# Patient Record
Sex: Male | Born: 1990 | Race: Black or African American | Hispanic: No | Marital: Married | Smoking: Never smoker
Health system: Southern US, Community
[De-identification: ages and names within clinical notes are randomized; demographics above are authoritative.]

## PROBLEM LIST (undated history)

## (undated) ENCOUNTER — Ambulatory Visit: Source: Home / Self Care

## (undated) DIAGNOSIS — I10 Essential (primary) hypertension: Secondary | ICD-10-CM

## (undated) DIAGNOSIS — R519 Headache, unspecified: Secondary | ICD-10-CM

## (undated) HISTORY — PX: NO PAST SURGERIES: SHX2092

## (undated) HISTORY — DX: Essential (primary) hypertension: I10

## (undated) HISTORY — DX: Headache, unspecified: R51.9

---

## 2014-12-27 ENCOUNTER — Encounter (HOSPITAL_BASED_OUTPATIENT_CLINIC_OR_DEPARTMENT_OTHER): Payer: Self-pay | Admitting: *Deleted

## 2014-12-27 ENCOUNTER — Emergency Department (HOSPITAL_BASED_OUTPATIENT_CLINIC_OR_DEPARTMENT_OTHER): Payer: BLUE CROSS/BLUE SHIELD

## 2014-12-27 ENCOUNTER — Emergency Department (HOSPITAL_BASED_OUTPATIENT_CLINIC_OR_DEPARTMENT_OTHER)
Admission: EM | Admit: 2014-12-27 | Discharge: 2014-12-27 | Disposition: A | Discharge status: Home / Self Care | Payer: BLUE CROSS/BLUE SHIELD | Attending: Physician Assistant | Admitting: Physician Assistant

## 2014-12-27 DIAGNOSIS — R1011 Right upper quadrant pain: Secondary | ICD-10-CM | POA: Diagnosis not present

## 2014-12-27 DIAGNOSIS — R0789 Other chest pain: Secondary | ICD-10-CM | POA: Diagnosis not present

## 2014-12-27 LAB — CBC WITH DIFFERENTIAL/PLATELET
Basophils Absolute: 0 10*3/uL (ref 0.0–0.1)
Basophils Relative: 0 % (ref 0–1)
Eosinophils Absolute: 0.1 10*3/uL (ref 0.0–0.7)
Eosinophils Relative: 1 % (ref 0–5)
HEMATOCRIT: 40.2 % (ref 39.0–52.0)
Hemoglobin: 13.1 g/dL (ref 13.0–17.0)
LYMPHS ABS: 2.8 10*3/uL (ref 0.7–4.0)
Lymphocytes Relative: 33 % (ref 12–46)
MCH: 28.2 pg (ref 26.0–34.0)
MCHC: 32.6 g/dL (ref 30.0–36.0)
MCV: 86.6 fL (ref 78.0–100.0)
Monocytes Absolute: 0.8 10*3/uL (ref 0.1–1.0)
Monocytes Relative: 9 % (ref 3–12)
NEUTROS ABS: 5 10*3/uL (ref 1.7–7.7)
NEUTROS PCT: 57 % (ref 43–77)
PLATELETS: 266 10*3/uL (ref 150–400)
RBC: 4.64 MIL/uL (ref 4.22–5.81)
RDW: 13.6 % (ref 11.5–15.5)
WBC: 8.7 10*3/uL (ref 4.0–10.5)

## 2014-12-27 LAB — URINE MICROSCOPIC-ADD ON

## 2014-12-27 LAB — URINALYSIS, ROUTINE W REFLEX MICROSCOPIC
Bilirubin Urine: NEGATIVE
Glucose, UA: NEGATIVE mg/dL
Hgb urine dipstick: NEGATIVE
Ketones, ur: NEGATIVE mg/dL
Nitrite: NEGATIVE
PH: 6 (ref 5.0–8.0)
Protein, ur: NEGATIVE mg/dL
Specific Gravity, Urine: 1.019 (ref 1.005–1.030)
Urobilinogen, UA: 0.2 mg/dL (ref 0.0–1.0)

## 2014-12-27 LAB — COMPREHENSIVE METABOLIC PANEL
ALK PHOS: 77 U/L (ref 38–126)
ALT: 27 U/L (ref 17–63)
ANION GAP: 8 (ref 5–15)
AST: 29 U/L (ref 15–41)
Albumin: 4.1 g/dL (ref 3.5–5.0)
BUN: 12 mg/dL (ref 6–20)
CHLORIDE: 106 mmol/L (ref 101–111)
CO2: 25 mmol/L (ref 22–32)
CREATININE: 0.97 mg/dL (ref 0.61–1.24)
Calcium: 9.1 mg/dL (ref 8.9–10.3)
GFR calc Af Amer: >60 mL/min (ref >60)
Glucose, Bld: 100 mg/dL — ABNORMAL HIGH (ref 65–99)
Potassium: 4.1 mmol/L (ref 3.5–5.1)
SODIUM: 139 mmol/L (ref 135–145)
Total Bilirubin: 0.4 mg/dL (ref 0.3–1.2)
Total Protein: 7.4 g/dL (ref 6.5–8.1)

## 2014-12-27 LAB — LIPASE, BLOOD: LIPASE: 25 U/L (ref 22–51)

## 2014-12-27 NOTE — ED Notes (Signed)
Pt. currently at ultrasound.  

## 2014-12-27 NOTE — ED Notes (Signed)
Pt waiting for md to discuss results of testing and will then dispo home.  Denies needs, currently sitting up on end of bed in nad.

## 2014-12-27 NOTE — ED Provider Notes (Signed)
CSN: [id number]     Arrival date & time 12/27/14  1038 History   First MD Initiated Contact with Patient 12/27/14 1048     No chief complaint on file.    (Consider location/radiation/quality/duration/timing/severity/associated sxs/prior Treatment) HPI Comments: Patient is a 18 her old male with past medical history significant for morbid obesity. Presenting today with right upper quadrant pain. It started today when he woke up. Is not made worse by eating. It is located in his right lower lung/right upper quadrant. No fevers. Able to eat and drink normally. No history of abdominal surgeries.   History reviewed. No pertinent past medical history. History reviewed. No pertinent past surgical history. No family history on file. History  Substance Use Topics  . Smoking status: Never Smoker   . Smokeless tobacco: Not on file  . Alcohol Use: Not on file    Review of Systems  Constitutional: Negative for activity change.  Respiratory: Negative for shortness of breath.   Cardiovascular: Negative for chest pain.  Gastrointestinal: Negative for abdominal pain.  Genitourinary: Negative for difficulty urinating.  Skin: Negative for rash.  Neurological: Negative for headaches.  Psychiatric/Behavioral: Negative for behavioral problems.      Allergies  Review of patient's allergies indicates no known allergies.  Home Medications   Prior to Admission medications   Not on File   BP 156/98 mmHg  Pulse 88  Temp(Src) 98.6 F (37 C) (Oral)  Resp 18  Ht  (1.753 m)  Wt 285 lb (129.275 kg)  BMI 42.07 kg/m2  SpO2 100% Physical Exam  Constitutional: He is oriented to person, place, and time. He appears well-nourished.  HENT:  Head: Normocephalic.  Mouth/Throat: Oropharynx is clear and moist.  Eyes: Conjunctivae are normal.  Neck: No tracheal deviation present.  Cardiovascular: Normal rate.   Pulmonary/Chest: Effort normal. No stridor. No respiratory distress.  Mild tenderness to  right lateral chest wall.  Abdominal: Soft. There is no guarding.  Mild tenderness to right upper quadrant.  Musculoskeletal: Normal range of motion. He exhibits no edema.  Neurological: He is oriented to person, place, and time. No cranial nerve deficit.  Skin: Skin is warm and dry. No rash noted. He is not diaphoretic.  Psychiatric: He has a normal mood and affect. His behavior is normal.  Nursing note and vitals reviewed.   ED Course  Procedures (including critical care time) Labs Review Labs Reviewed  URINALYSIS, ROUTINE W REFLEX MICROSCOPIC (NOT AT [facility]) - Abnormal; Notable for the following:    Leukocytes, UA TRACE (*)    All other components within normal limits  URINE MICROSCOPIC-ADD ON - Abnormal; Notable for the following:    Squamous Epithelial / LPF FEW (*)    Bacteria, UA FEW (*)    All other components within normal limits  LIPASE, BLOOD  COMPREHENSIVE METABOLIC PANEL  CBC WITH DIFFERENTIAL/PLATELET    Imaging Review No results found.   EKG Interpretation None      MDM   Final diagnoses:  RUQ pain    She is a 24 year old male with morbid obesity presenting today with right upper quadrant pain. It is not made worse on moving. Not made worse by food. It is intermittent made worse with deep breaths. Patient perc negative.  Concern for gallbladder disease versus occult pneumonia versusmsk pain. We will get labs and ultrasound chest x-ray.  If all normal plan to discharge patient home.    Dakarai Mcglocklin Randall An, MD 12/27/14 1459

## 2014-12-27 NOTE — ED Notes (Signed)
C/o sharp stabbing pain on right side in rib area sudden onset this am. Pain comes and goes and is dull pain when it is not a stabbing pain. No injury. No urinating sx. No radiation.

## 2014-12-27 NOTE — Discharge Instructions (Signed)

## 2016-06-03 IMAGING — US US ABDOMEN COMPLETE
1 series · 13 of 25 positions shown · non-contrast
Comparison: None.

CLINICAL DATA: 24-year-old male with right upper quadrant pain for
several hours. Initial encounter.

EXAM:
ULTRASOUND ABDOMEN COMPLETE

[Series 1: us abdomen complete · 0.16mm/px · 13 of 77 slices shown]
[im 1/77]
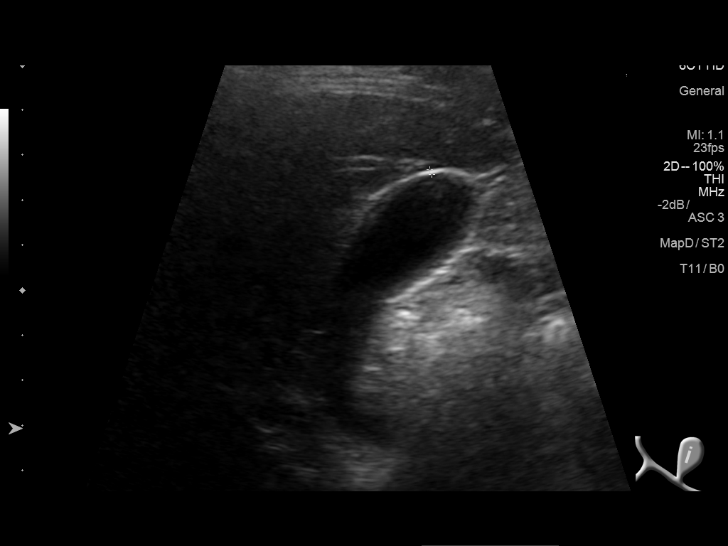
[im 7/77]
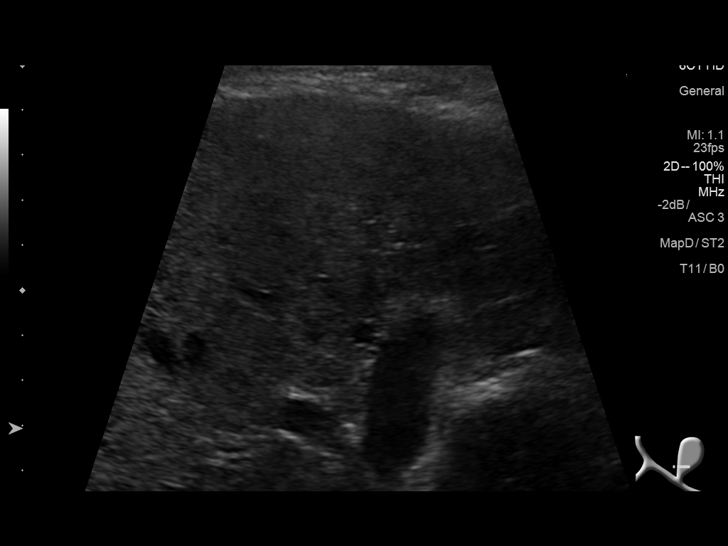
[im 13/77]
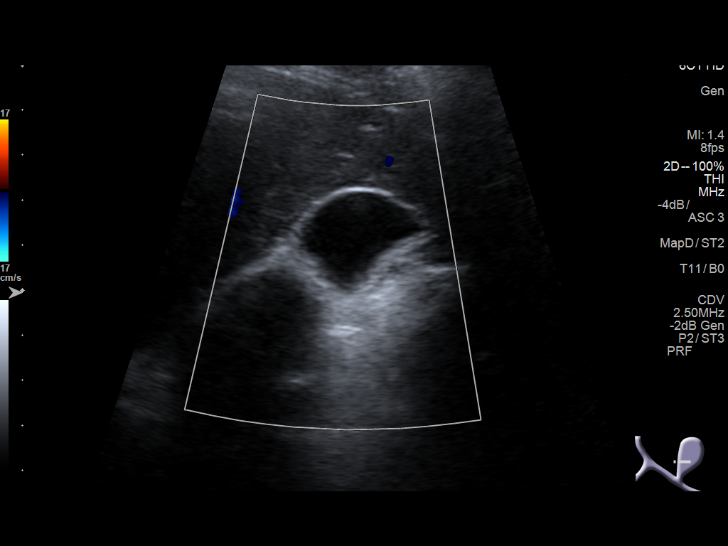
[im 20/77]
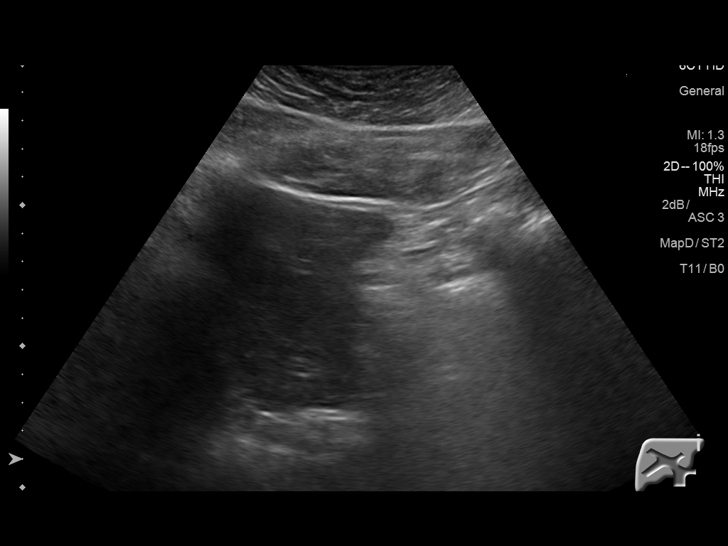
[im 26/77]
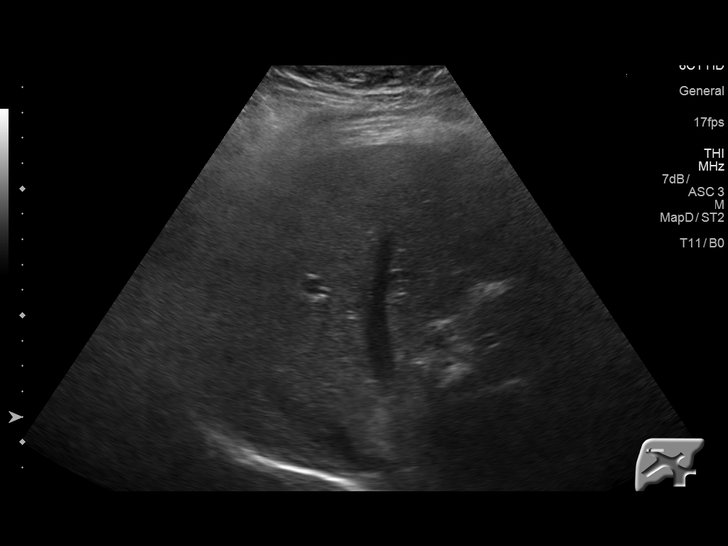
[im 32/77]
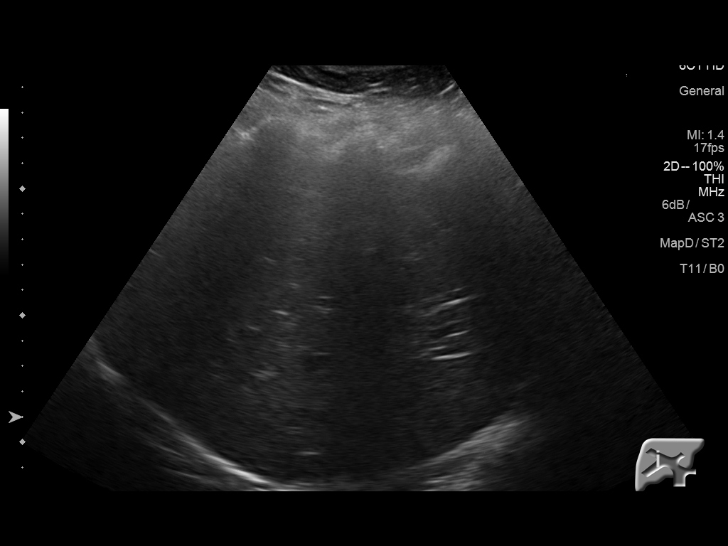
[im 39/77]
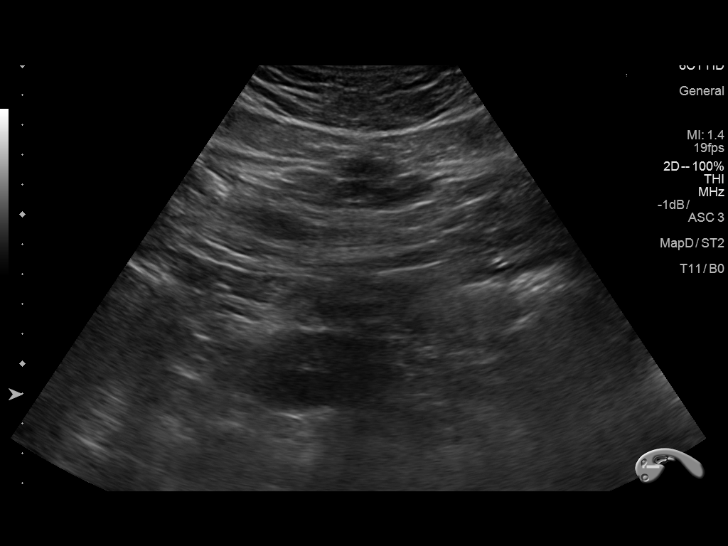
[im 45/77]
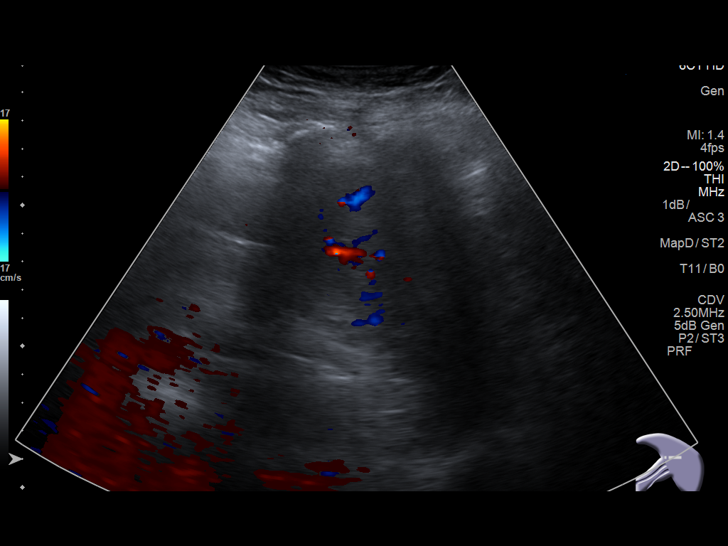
[im 51/77]
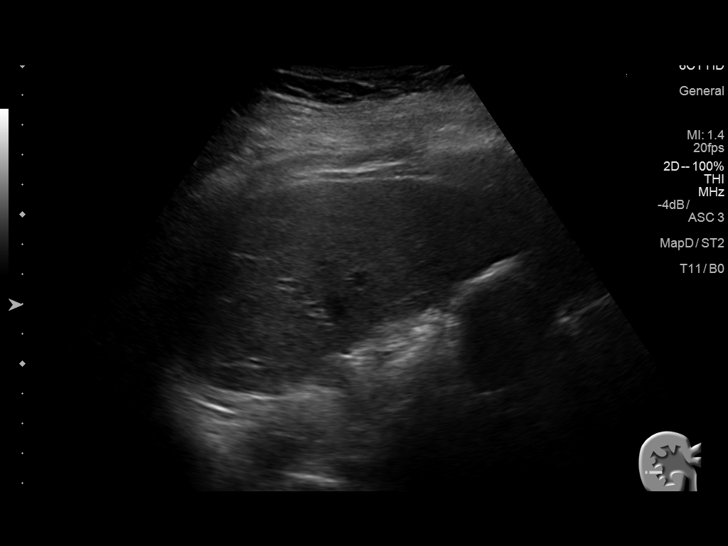
[im 58/77]
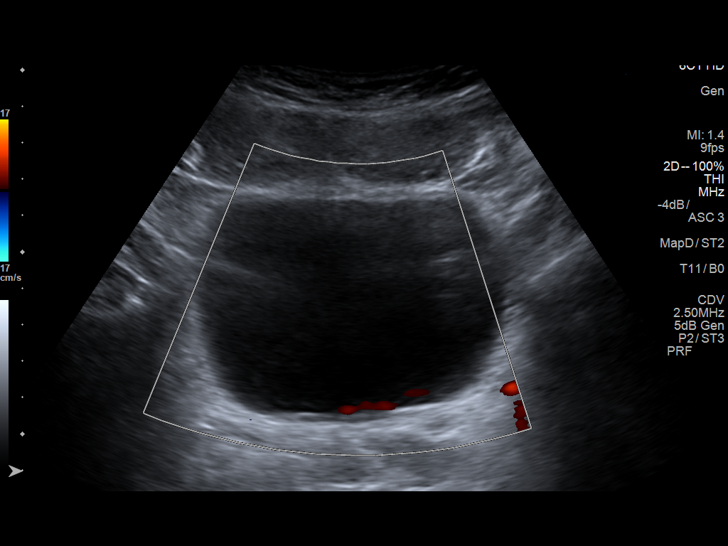
[im 64/77]
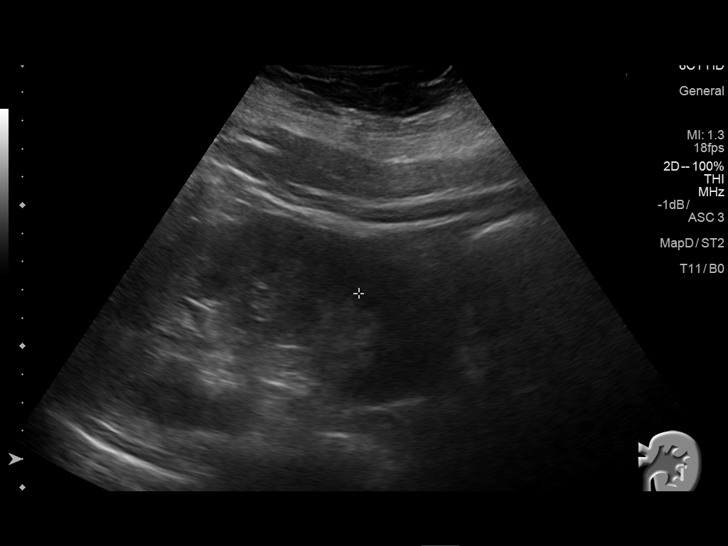
[im 70/77]
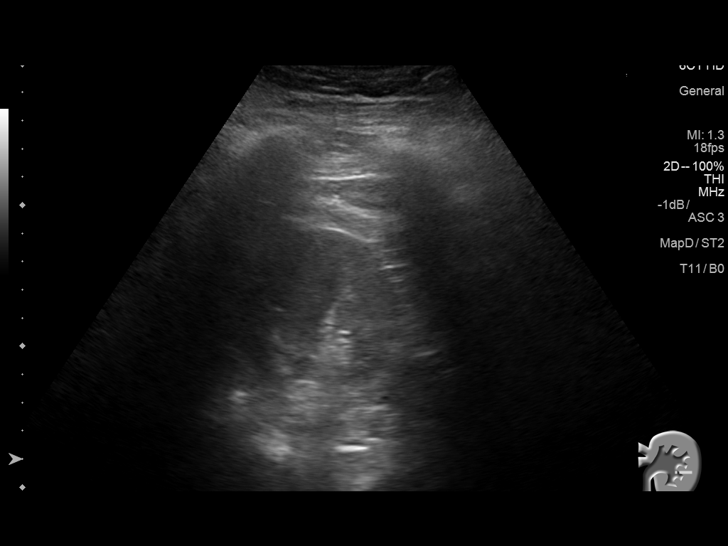
[im 77/77]
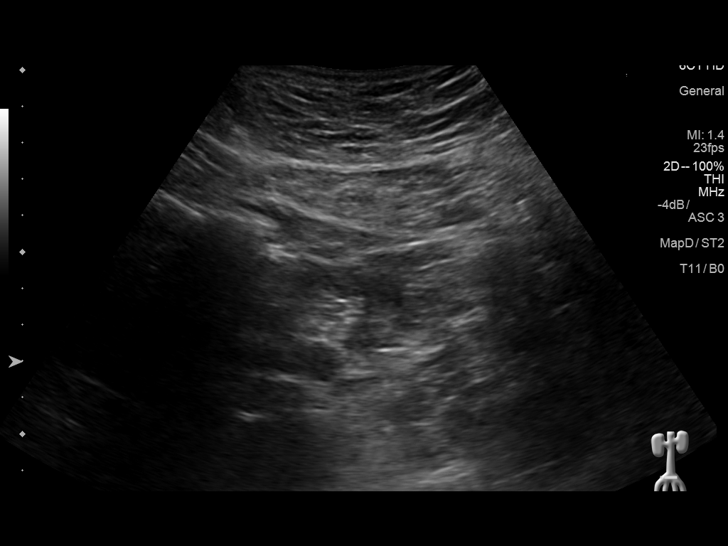

[13 of 25 positions shown; findings below may reference images not displayed]

FINDINGS: Gallbladder: Partially decompressed. Gallbladder wall thickness
within normal limits, 2 mm. No gallstones or sludge identified. No
pericholecystic fluid. No sonographic Murphy sign elicited.

Common bile duct: Diameter: 4 mm, normal

Liver: No focal lesion identified. Within normal limits in
parenchymal echogenicity.

IVC: No abnormality visualized.

Pancreas: Echogenicity within normal limits for age.

Spleen: Size and appearance within normal limits.

Right Kidney: The right kidney is located in the right lower
quadrant, hemipelvis. It measures 11.1 cm in length. No
hydronephrosis or renal mass is evident..

Left Kidney: Length: 13.1 cm. Orthotopic. Echogenicity within normal
limits. No mass or hydronephrosis visualized.

Abdominal aorta: No aneurysm visualized.

Other findings: Both ureteral jets are detected with Doppler
ultrasound of the urinary bladder.
IMPRESSION: 1. No gallstones identified. No evidence of acute cholecystitis or
biliary obstruction.
2. Pelvic kidney on the right, normal anatomic variant.

## 2016-08-23 IMAGING — CR DG CHEST 2V
2 series · 2 of 2 positions shown · non-contrast
Comparison: None.

CLINICAL DATA: Right-sided chest pain today.

EXAM:
CHEST  2 VIEW

[w chest pa]
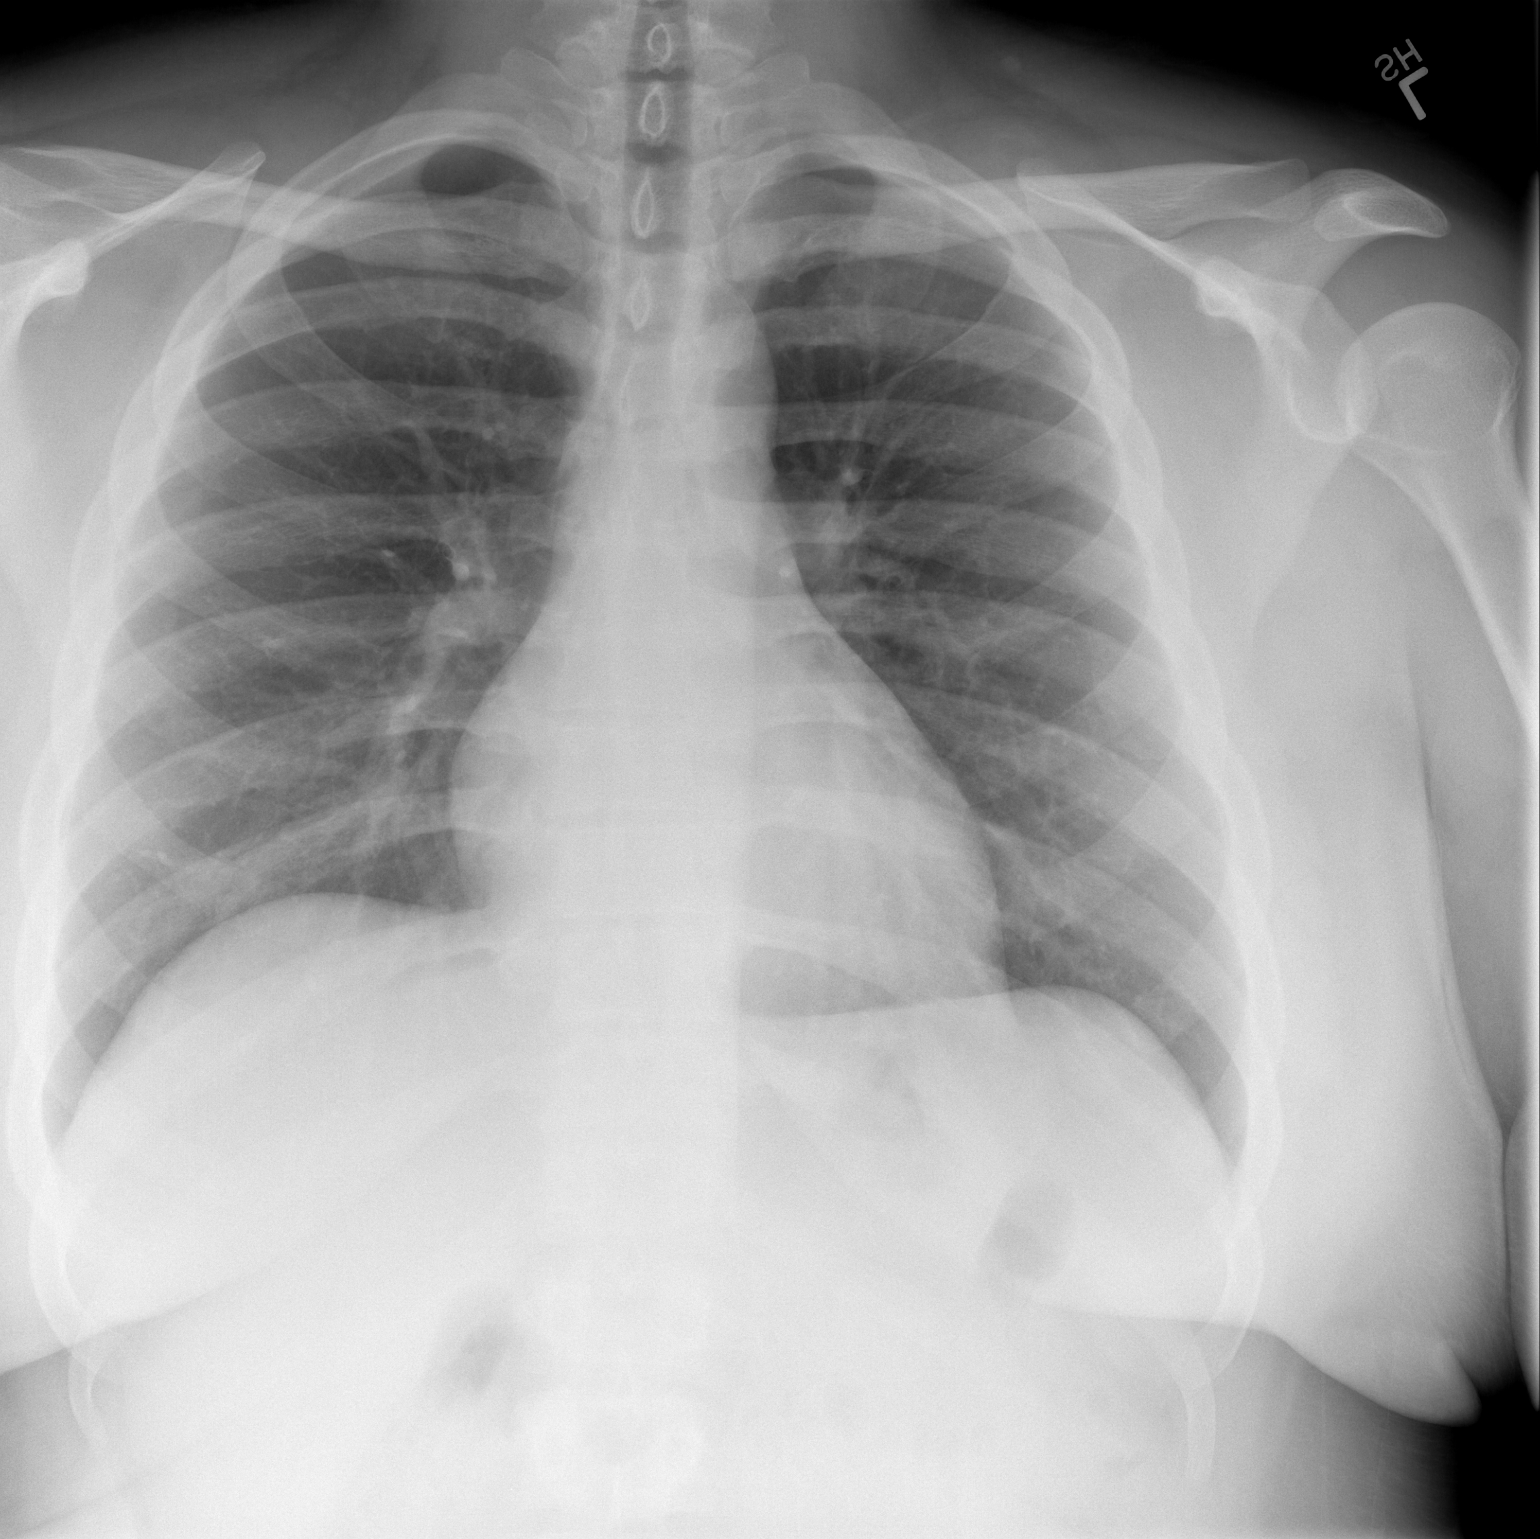

[w chest lat]
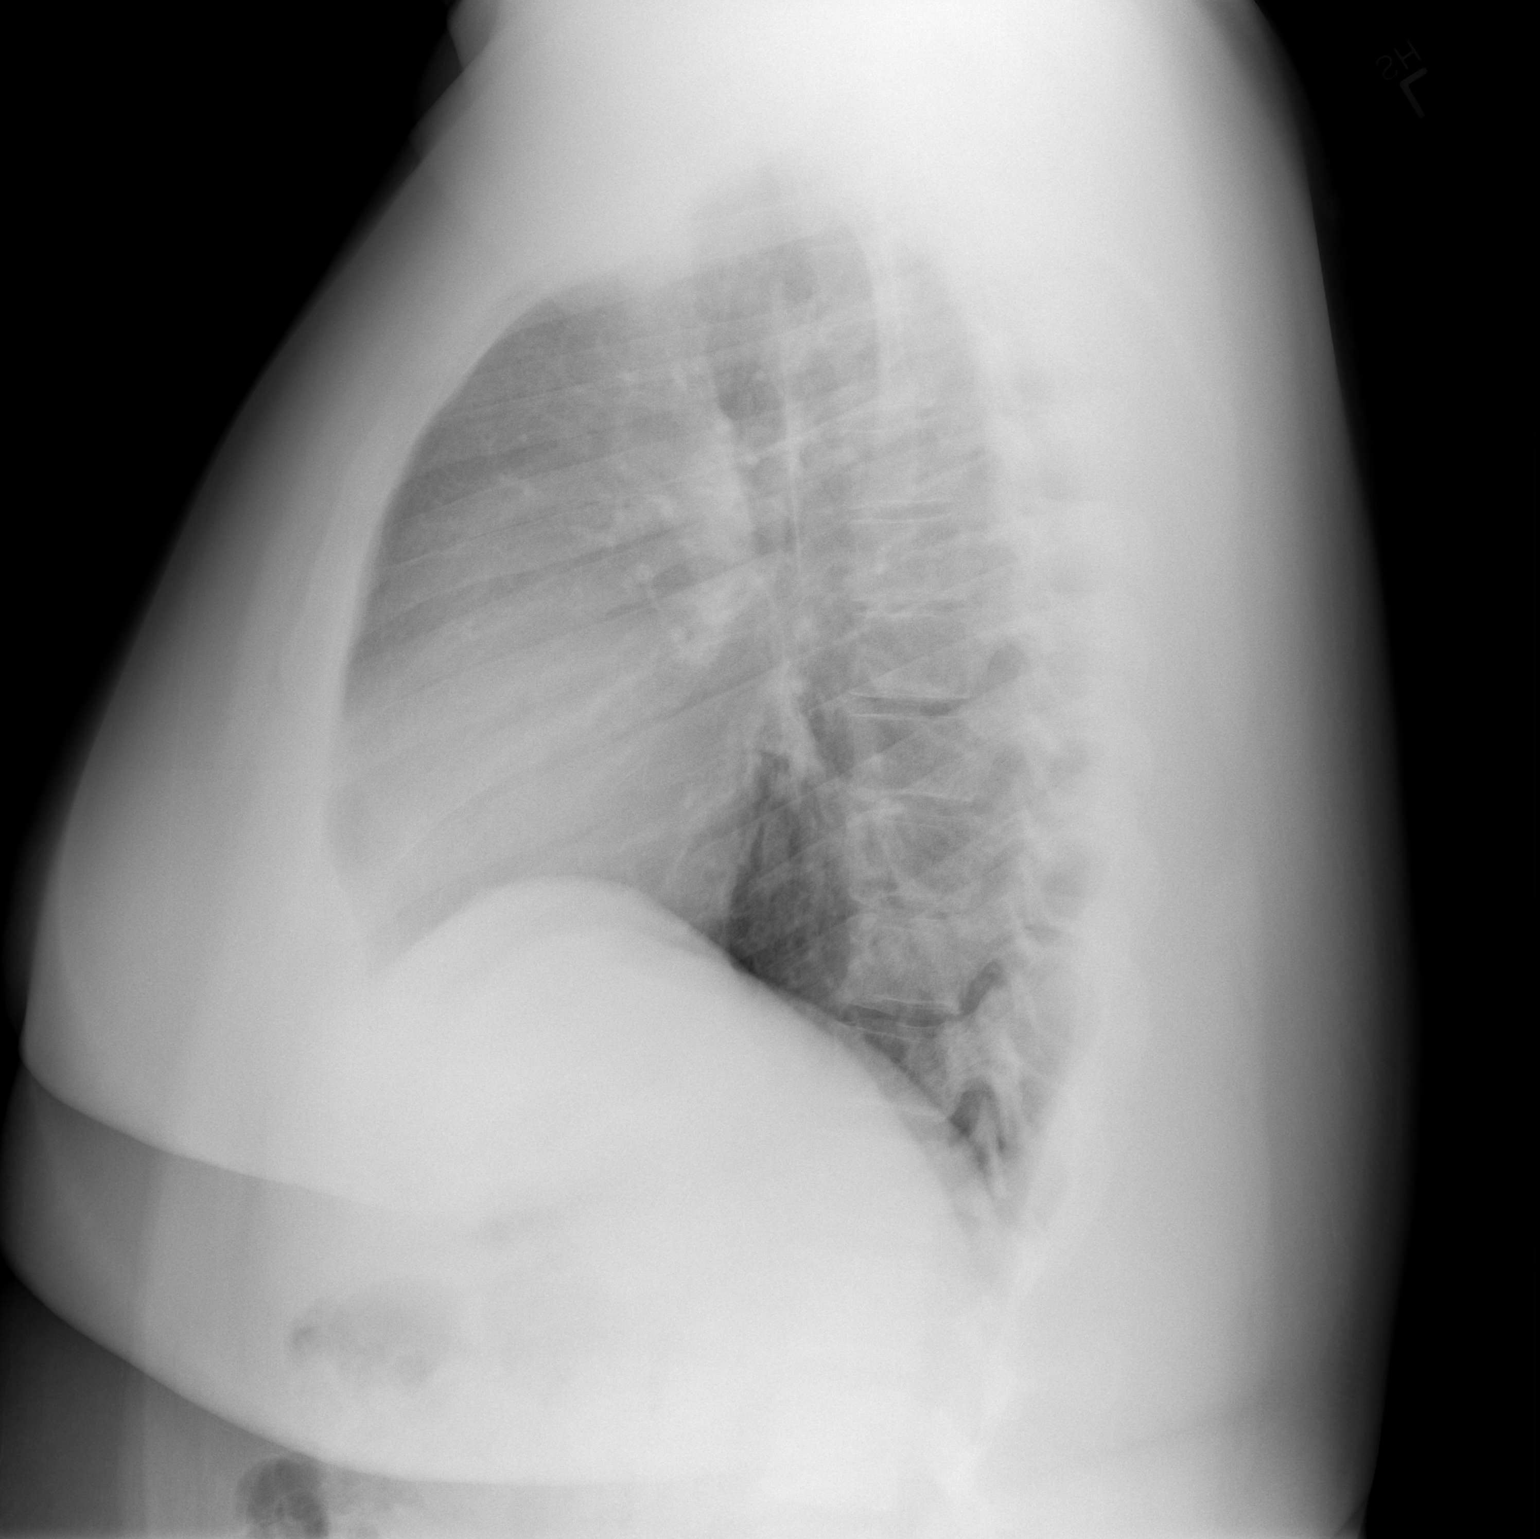

[2 of 2 positions shown; findings below may reference images not displayed]

FINDINGS: The heart size and mediastinal contours are within normal limits.
Both lungs are clear. The visualized skeletal structures are
unremarkable.
IMPRESSION: Normal chest x-ray.

## 2017-03-06 ENCOUNTER — Telehealth: Payer: Self-pay | Admitting: Behavioral Health

## 2017-03-06 NOTE — Telephone Encounter (Signed)
Patient voiced that he will call the office back within a few minutes to complete Pre-Visit Info.

## 2017-03-07 ENCOUNTER — Ambulatory Visit: Payer: BLUE CROSS/BLUE SHIELD | Admitting: Family Medicine

## 2021-05-23 ENCOUNTER — Encounter: Payer: Self-pay | Admitting: Family Medicine

## 2021-05-23 ENCOUNTER — Ambulatory Visit (INDEPENDENT_AMBULATORY_CARE_PROVIDER_SITE_OTHER): Payer: 59 | Admitting: Family Medicine

## 2021-05-23 VITALS — BP 158/100 | HR 101 | Temp 98.6°F | Ht 69.0 in | Wt 375.4 lb

## 2021-05-23 DIAGNOSIS — Z23 Encounter for immunization: Secondary | ICD-10-CM | POA: Diagnosis not present

## 2021-05-23 DIAGNOSIS — I10 Essential (primary) hypertension: Secondary | ICD-10-CM

## 2021-05-23 MED ORDER — AMLODIPINE BESYLATE 5 MG PO TABS: 5.0000 mg | ORAL_TABLET | Freq: Every day | ORAL | 3 refills | Status: DC

## 2021-05-23 NOTE — Patient Instructions (Addendum)
Check your blood pressures 2-3 times per week, alternating the time of day you check it. If it is high, considering waiting 1-2 minutes and rechecking. If it gets higher, your anxiety is likely creeping up and we should avoid rechecking.   When you check your BP, make sure you have been doing something calm/relaxing 5 minutes prior to checking. Both feet should be flat on the floor and you should be sitting. Use your left arm and make sure it is in a relaxed position (on a table), and that the cuff is at the approximate level/height of your heart.  Aim to do some physical exertion for 150 minutes per week. This is typically divided into 5 days per week, 30 minutes per day. The activity should be enough to get your heart rate up. Anything is better than nothing if you have time constraints.  If you want to see a weight loss specialist, let me know please.   Keep the diet clean and stay active.  Let us know if you need anything.

## 2021-05-23 NOTE — Progress Notes (Signed)
Chief Complaint  Patient presents with   New Patient (Initial Visit)       New Patient Visit SUBJECTIVE: HPI: Marcus Barker is an 31 y.o.male who is being seen for establishing care.  Elevated blood pressure Patient presents for discussion of blood pressure He does intermittently monitor home blood pressures. Blood pressures ranging on average from 120-130's/90-100's. He is not on any medication at this time and has never trialed anything before.  He is sometimes adhering to a healthy diet overall. Exercise: none No Cp or SOB.  Morbid obesity Weighed around 315 lbs early 2020. Gained weight due to being more sedentary. Tried diet/exercise in past that did well. Injuries and busy with work   Past Medical History:  Diagnosis Date   Frequent headaches    Hypertension    Past Surgical History:  Procedure Laterality Date   NO PAST SURGERIES     Family History  Problem Relation Age of Onset   Hypertension Neg Hx    No Known Allergies  Current Outpatient Medications:    amLODipine (NORVASC) 5 MG tablet, Take 1 tablet (5 mg total) by mouth daily., Disp: 30 tablet, Rfl: 3  OBJECTIVE: BP (!) 158/100    Pulse (!) 101    Temp 98.6 F (37 C) (Oral)    Ht 5\' 9"  (1.753 m)    Wt (!) 375 lb 6 oz (170.3 kg)    SpO2 98%    BMI 55.43 kg/m  General:  well developed, well nourished, in no apparent distress Lungs:  clear to auscultation, breath sounds equal bilaterally, no respiratory distress Cardio:  regular rate and rhythm, no LE edema or bruits Neuro:  gait normal Psych: well oriented with normal range of affect and appropriate judgment/insight  ASSESSMENT/PLAN: Essential hypertension - Plan: amLODipine (NORVASC) 5 MG tablet  Morbid obesity (HCC)  Chronic, uncontrolled. Monitor BP at home. Add Norvasc 5 mg/d. Counseled on diet/exercise. Chronic, uncontrolled. Discussed [facility] referral, bariatric surgery and pharmacotherapy. Politely declined these options for now, will let know  anything changes.  Patient should return in 1 mo to reck. The patient voiced understanding and agreement to the plan.   [place] Geddes, DO 05/23/21  3:12 PM

## 2021-06-22 ENCOUNTER — Ambulatory Visit: Payer: 59 | Admitting: Family Medicine

## 2021-06-29 ENCOUNTER — Ambulatory Visit: Payer: 59 | Admitting: Family Medicine

## 2021-07-06 ENCOUNTER — Ambulatory Visit: Payer: 59 | Admitting: Family Medicine

## 2021-07-06 ENCOUNTER — Encounter: Payer: Self-pay | Admitting: Family Medicine

## 2021-07-06 VITALS — BP 142/90 | HR 101 | Temp 98.2°F | Ht 69.0 in | Wt 370.5 lb

## 2021-07-06 DIAGNOSIS — I10 Essential (primary) hypertension: Secondary | ICD-10-CM | POA: Diagnosis not present

## 2021-07-06 NOTE — Patient Instructions (Addendum)
Keep the diet clean and stay active.  Check your blood pressures 2-3 times per week, alternating the time of day you check it. If it is high, considering waiting 1-2 minutes and rechecking. If it gets higher, your anxiety is likely creeping up and we should avoid rechecking.   Send me your readings in 2 weeks and we will go from there. We may not need to increase any medication.   Let us know if you need anything.

## 2021-07-06 NOTE — Progress Notes (Signed)
Chief Complaint  Patient presents with   Follow-up    Hypertension     Subjective Marcus Barker is a 31 y.o. male who presents for hypertension follow up. He does monitor home blood pressures. Blood pressures ranging from 130's/80-90's on average. He is usually compliant with medication- Norvasc 5 mg/d. Patient has these side effects of medication: none He is usually adhering to a healthy diet overall. Current exercise: walking No chest pain or shortness of breath.   Past Medical History:  Diagnosis Date   Frequent headaches    Hypertension     Exam BP (!) 142/90    Pulse (!) 101    Temp 98.2 F (36.8 C) (Oral)    Ht 5\' 9"  (1.753 m)    Wt (!) 370 lb 8 oz (168.1 kg)    SpO2 99%    BMI 54.71 kg/m  General:  well developed, well nourished, in no apparent distress Heart: RRR, no bruits, no LE edema Lungs: clear to auscultation, no accessory muscle use Psych: well oriented with normal range of affect and appropriate judgment/insight  Essential hypertension  Chronic, unstable.  Go back on Norvasc 5 mg daily.  Counseled on diet and exercise. Monitor BP at home, send me a message with readings in 2 weeks and we will dictate further f/u and management from there.  The patient voiced understanding and agreement to the plan.  [place], DO 07/06/21  3:18 PM

## 2024-04-20 ENCOUNTER — Ambulatory Visit: Payer: Self-pay

## 2024-04-20 NOTE — Telephone Encounter (Signed)
 FYI Only or Action Required?: FYI only for provider: Going to UC.  Patient was last seen in primary care on 07/06/2021 by Frann Mabel Mt, DO.  Called Nurse Triage reporting Nasal Congestion and Sore Throat.  Symptoms began several days ago.  Interventions attempted: OTC meds.  Symptoms are: gradually worsening.  Triage Disposition: See Physician Within 24 Hours  Patient/caregiver understands and will follow disposition?: Yes  Copied from CRM [id number]. Topic: Clinical - Red Word Triage >> Apr 20, 2024 12:50 PM Franky GRADE wrote: Red Word that prompted transfer to Nurse Triage: Patient's spouse is calling because patient has been experiencing fatigue, congestion, sore throat along with fever for the past 6 days with no improvement. Reason for Disposition  Fever present > 3 days (72 hours)  Answer Assessment - Initial Assessment Questions Multiple URI symptoms for the last 6 days- low grade fever, fatigue, nasal congestion, sore throat, mild productive cough with green/yellow phlegm. Denies CP, SOB, Dizziness. Treating with OTC meds.  Wanting sooner OV than tomorrow to assess as patient is back to work. UC advised for quicker evaluation and to follow up with PCP if not resolving.  ED precautions understood.   1. ONSET: When did the throat start hurting? (Hours or days ago)      6 days  2. SEVERITY: How bad is the sore throat? (Scale 1-10; mild, moderate or severe)     7/10 resting- worse with talking/coughing  3. STREP EXPOSURE: Has there been any exposure to strep within the past week? If Yes, ask: What type of contact occurred?      Unsure- no one they are aware of- community they live in has been sick  4.  VIRAL SYMPTOMS: Are there any symptoms of a cold, such as a runny nose, cough, hoarse voice or red eyes?      Nasal congestion  5. FEVER: Do you have a fever? If Yes, ask: What is your temperature, how was it measured, and when did it start?     100.  something 6. PUS ON THE TONSILS: Is there pus on the tonsils in the back of your throat?     Discolored but unsure about pus or patches 7. OTHER SYMPTOMS: Do you have any other symptoms? (e.g., difficulty breathing, headache, rash)     Fatigue, mild cough with green/yellow phelgm, ear pressure  Protocols used: Sore Throat-A-AH

## 2024-04-20 NOTE — Telephone Encounter (Signed)
 Wife calling back in , per PAS she reports the urgent care was too expensive. Line disconnected prior to warm transfer. Outbound call made to wife and able to connect with her. She states patient did find another urgent care to be seen at today and is being treated. Wife states today at urgent care he was told to follow up with his PCP on his BP being elevated during his visit (she thinks it was 180/100). Patient overdue to annual wellness/physical, wife scheduled patient for next week. She is unsure if he has been taking his blood pressure medications. RN advised wife to have patient keep a daily log of BP readings to bring with him for visit with PCP next week and to call back if BP reads 160 or hight over 100 or higher.

## 2024-04-20 NOTE — Telephone Encounter (Signed)
 Pt going to [place]

## 2024-04-28 ENCOUNTER — Encounter: Admitting: Family Medicine

## 2024-05-07 ENCOUNTER — Ambulatory Visit: Admitting: Sports Medicine

## 2024-05-07 ENCOUNTER — Ambulatory Visit: Payer: Self-pay

## 2024-05-07 VITALS — BP 145/90 | HR 99 | Temp 98.2°F | Ht 69.0 in | Wt 375.4 lb

## 2024-05-07 DIAGNOSIS — R0981 Nasal congestion: Secondary | ICD-10-CM | POA: Diagnosis not present

## 2024-05-07 DIAGNOSIS — I10 Essential (primary) hypertension: Secondary | ICD-10-CM

## 2024-05-07 DIAGNOSIS — R350 Frequency of micturition: Secondary | ICD-10-CM | POA: Diagnosis not present

## 2024-05-07 LAB — POC URINALSYSI DIPSTICK (AUTOMATED)
Glucose, UA: NEGATIVE
Ketones, UA: NEGATIVE
Nitrite, UA: POSITIVE
Protein, UA: POSITIVE — AB
Spec Grav, UA: 1.015
Urobilinogen, UA: 1 U/dL
pH, UA: 6

## 2024-05-07 MED ORDER — AMOXICILLIN-POT CLAVULANATE 500-125 MG PO TABS: 1.0000 | ORAL_TABLET | Freq: Three times a day (TID) | ORAL | 0 refills | Status: DC

## 2024-05-07 NOTE — Progress Notes (Signed)
 "  Careteam: Patient Care Team: Frann Mabel Mt, DO as PCP - General (Family Medicine)  Allergies[1]  Chief Complaint  Patient presents with   Urinary Frequency    Pt started having frequent urination that started yesterday. He also noticed some blood     Discussed the use of AI scribe software for clinical note transcription with the patient, who gave verbal consent to proceed.  History of Present Illness  Marcus Barker is a 33 year old male who presents with symptoms of a urinary tract infection.  He began experiencing frequent urge to urinate, passing only small amounts of urine, dysuria, and hematuria, which was more pronounced last night. No lower back or flank pain. No history of urinary infections in the past six months.  He has had a fever with chills on Tuesday and Wednesday, initially attributing it to dehydration following a bout of norovirus last week. He has taken Tylenol occasionally to manage the fever. Mild and infrequent nausea, but no dizziness or lightheadedness.  In addition to the UTI symptoms, he has been experiencing respiratory issues for about three and a half weeks, starting with nasal congestion and a sore throat. These symptoms are gradually improving, though he still experiences some postnasal drip and ear congestion. No current cough, rhinorrhea, or sinus pain. He has been using Flonase and cetirizine to manage these symptoms. He tested negative for both flu and COVID-19 recently.  No new sexual partners and no known medication allergies.    Review of Systems:  Review of Systems  Constitutional:  Positive for chills and fever.  HENT:  Positive for congestion and sore throat. Negative for ear pain and tinnitus.   Respiratory:  Positive for cough. Negative for sputum production and shortness of breath.   Cardiovascular:  Negative for chest pain, palpitations and leg swelling.  Gastrointestinal:  Positive for abdominal pain. Negative for nausea and  vomiting.  Genitourinary:  Positive for dysuria, frequency, hematuria and urgency. Negative for flank pain.  Neurological:  Negative for dizziness.   Negative unless indicated in HPI.   Patient Active Problem List   Diagnosis Date Noted   Essential hypertension 07/06/2021   Past Medical History:  Diagnosis Date   Frequent headaches    Hypertension    Past Surgical History:  Procedure Laterality Date   NO PAST SURGERIES     Social History[2] Family History  Problem Relation Age of Onset   Hypertension Neg Hx    Allergies[3]  Medications: Patient's Medications  New Prescriptions   AMOXICILLIN-CLAVULANATE (AUGMENTIN) 500-125 MG TABLET    Take 1 tablet by mouth 3 (three) times daily.  Previous Medications   AMLODIPINE  (NORVASC ) 5 MG TABLET    Take 1 tablet (5 mg total) by mouth daily.   CETIRIZINE (ZYRTEC) 10 MG TABLET    Take 10 mg by mouth daily.   FLUTICASONE (FLONASE) 50 MCG/ACT NASAL SPRAY    Place into both nostrils daily.  Modified Medications   No medications on file  Discontinued Medications   No medications on file    Physical Exam: Vitals:   05/07/24 1410 05/07/24 1437  BP: (!) 148/95 (!) 145/90  Pulse: 99   Temp: 98.2 F (36.8 C)   TempSrc: Oral   SpO2: 100%   Weight: (!) 375 lb 6.4 oz (170.3 kg)   Height:  5' 9 (1.753 m)   Body mass index is 55.44 kg/m. BP Readings from Last 3 Encounters:  05/07/24 (!) 145/90  07/06/21 (!) 142/90  05/23/21 ROLLEN)  158/100   Wt Readings from Last 3 Encounters:  05/07/24 (!) 375 lb 6.4 oz (170.3 kg)  07/06/21 (!) 370 lb 8 oz (168.1 kg)  05/23/21 (!) 375 lb 6 oz (170.3 kg)    Physical Exam Constitutional:      Appearance: Normal appearance.  HENT:     Head: Normocephalic and atraumatic.     Right Ear: Tympanic membrane normal.     Left Ear: Tympanic membrane normal.     Mouth/Throat:     Pharynx: Posterior oropharyngeal erythema present. No oropharyngeal exudate.  Cardiovascular:     Rate and Rhythm:  Normal rate and regular rhythm.     Pulses: Normal pulses.     Heart sounds: Normal heart sounds.  Pulmonary:     Effort: No respiratory distress.     Breath sounds: No stridor. No wheezing or rales.  Abdominal:     General: Bowel sounds are normal. There is no distension.     Palpations: Abdomen is soft.     Tenderness: There is no abdominal tenderness. There is no right CVA tenderness or guarding.  Musculoskeletal:        General: No swelling.  Neurological:     Mental Status: He is alert. Mental status is at baseline.     Motor: No weakness.     Labs reviewed: Basic Metabolic Panel: No results for input(s): NA, K, CL, CO2, GLUCOSE, BUN, CREATININE, CALCIUM, MG, PHOS, TSH in the last 8760 hours. Liver Function Tests: No results for input(s): AST, ALT, ALKPHOS, BILITOT, PROT, ALBUMIN in the last 8760 hours. No results for input(s): LIPASE, AMYLASE in the last 8760 hours. No results for input(s): AMMONIA in the last 8760 hours. CBC: No results for input(s): WBC, NEUTROABS, HGB, HCT, MCV, PLT in the last 8760 hours. Lipid Panel: No results for input(s): CHOL, HDL, LDLCALC, TRIG, CHOLHDL, LDLDIRECT in the last 8760 hours. TSH: No results for input(s): TSH in the last 8760 hours. A1C: No results found for: HGBA1C  Assessment & Plan Urinary frequency Urine dipstick positive Will send augentin to pharmacy  Urine for culture Increase oral hydration Orders:   POCT Urinalysis Dipstick (Automated)   Urine Culture  Nasal congestion Afebrile Cont with flonase, claritin    Primary hypertension Bp high  Pt says he has appt with his PCP for follow up Avoid salty foods Exercise regularly Cont with amlodipine           [1] No Known Allergies [2]  Social History Tobacco Use   Smoking status: Never   Smokeless tobacco: Never  Substance Use Topics   Alcohol use: Not Currently   Drug use: Never  [3]  No Known Allergies  "

## 2024-05-07 NOTE — Telephone Encounter (Signed)
 FYI Only or Action Required?: FYI only for provider: appointment scheduled on 05/07/24.  Patient was last seen in primary care on 07/06/2021 by Frann Mabel Mt, DO.  Called Nurse Triage reporting Urinary Tract Infection.  Symptoms began yesterday.  Interventions attempted: OTC medications: tylenol.  Symptoms are: unchanged.  Triage Disposition: See Physician Within 24 Hours  Patient/caregiver understands and will follow disposition?: Yes   Reason for Disposition  Urinating more frequently than usual (i.e., frequency) OR new-onset of the feeling of an urgent need to urinate (i.e., urgency)  Answer Assessment - Initial Assessment Questions No available appt with pcp. Scheduled with alt prov 05/07/24  Advised call back or ED if symptoms worsen. Patient verbalized understanding.  1. SYMPTOM: What's the main symptom you're concerned about? (e.g., frequency, incontinence)     Frequency, pain/burning with urination, blood urine, lower pelvic pain intermittent 2. ONSET: When did the    start?     yesterday 3. PAIN: Is there any pain? If Yes, ask: How bad is it? (Scale: 1-10; mild, moderate, severe)     4/10 4. CAUSE: What do you think is causing the symptoms?     uti 5. OTHER SYMPTOMS: Do you have any other symptoms? (e.g., blood in urine, fever, flank pain, pain with urination)     fever(101.0 temp, tylenol), chills  denies n/v, back pain  Protocols used: Urinary Symptoms-A-AH

## 2024-05-07 NOTE — Telephone Encounter (Signed)
 Copied from CRM #[id number]. Topic: Clinical - Red Word Triage >> May 07, 2024  8:32 AM Roselie BROCKS wrote: Red Word that prompted transfer to Nurse Triage: Patient states he feels he has a UTI, he has burning and extreme pain, and blood when urinating,   Left message to call back.

## 2024-05-14 ENCOUNTER — Ambulatory Visit: Payer: Self-pay | Admitting: Family Medicine

## 2024-05-14 ENCOUNTER — Encounter: Payer: Self-pay | Admitting: Family Medicine

## 2024-05-14 ENCOUNTER — Ambulatory Visit: Admitting: Family Medicine

## 2024-05-14 VITALS — BP 142/92 | HR 80 | Temp 97.7°F | Resp 16 | Ht 69.0 in | Wt 372.6 lb

## 2024-05-14 DIAGNOSIS — I10 Essential (primary) hypertension: Secondary | ICD-10-CM

## 2024-05-14 DIAGNOSIS — Z1159 Encounter for screening for other viral diseases: Secondary | ICD-10-CM

## 2024-05-14 DIAGNOSIS — M25552 Pain in left hip: Secondary | ICD-10-CM | POA: Diagnosis not present

## 2024-05-14 DIAGNOSIS — M25551 Pain in right hip: Secondary | ICD-10-CM

## 2024-05-14 DIAGNOSIS — Z0000 Encounter for general adult medical examination without abnormal findings: Secondary | ICD-10-CM | POA: Diagnosis not present

## 2024-05-14 DIAGNOSIS — Z114 Encounter for screening for human immunodeficiency virus [HIV]: Secondary | ICD-10-CM | POA: Diagnosis not present

## 2024-05-14 LAB — LIPID PANEL
Cholesterol: 173 mg/dL (ref 28–200)
HDL: 36.4 mg/dL — ABNORMAL LOW
LDL Cholesterol: 118 mg/dL — ABNORMAL HIGH (ref 10–99)
NonHDL: 136.77
Total CHOL/HDL Ratio: 5
Triglycerides: 92 mg/dL (ref 10.0–149.0)
VLDL: 18.4 mg/dL (ref 0.0–40.0)

## 2024-05-14 LAB — CBC
HCT: 42.5 % (ref 39.0–52.0)
Hemoglobin: 13.5 g/dL (ref 13.0–17.0)
MCHC: 31.8 g/dL (ref 30.0–36.0)
MCV: 83 fl (ref 78.0–100.0)
Platelets: 448 K/uL — ABNORMAL HIGH (ref 150.0–400.0)
RBC: 5.13 Mil/uL (ref 4.22–5.81)
RDW: 15.8 % — ABNORMAL HIGH (ref 11.5–15.5)
WBC: 11.5 K/uL — ABNORMAL HIGH (ref 4.0–10.5)

## 2024-05-14 LAB — COMPREHENSIVE METABOLIC PANEL WITH GFR
ALT: 40 U/L (ref 3–53)
AST: 24 U/L (ref 5–37)
Albumin: 4.4 g/dL (ref 3.5–5.2)
Alkaline Phosphatase: 92 U/L (ref 39–117)
BUN: 15 mg/dL (ref 6–23)
CO2: 27 meq/L (ref 19–32)
Calcium: 9.7 mg/dL (ref 8.4–10.5)
Chloride: 100 meq/L (ref 96–112)
Creatinine, Ser: 0.89 mg/dL (ref 0.40–1.50)
GFR: 110.79 mL/min
Glucose, Bld: 102 mg/dL — ABNORMAL HIGH (ref 70–99)
Potassium: 4.3 meq/L (ref 3.5–5.1)
Sodium: 137 meq/L (ref 135–145)
Total Bilirubin: 0.4 mg/dL (ref 0.2–1.2)
Total Protein: 8.1 g/dL (ref 6.0–8.3)

## 2024-05-14 MED ORDER — OLMESARTAN MEDOXOMIL 20 MG PO TABS: 20.0000 mg | ORAL_TABLET | Freq: Every day | ORAL | 2 refills | Status: AC

## 2024-05-14 MED ORDER — AMLODIPINE BESYLATE 10 MG PO TABS: 10.0000 mg | ORAL_TABLET | Freq: Every day | ORAL | 1 refills | Status: AC

## 2024-05-14 NOTE — Patient Instructions (Addendum)
 Give us  2-3 business days to get the results of your labs back.   Keep the diet clean and stay active.  Aim to do some physical exertion for 150 minutes per week. This is typically divided into 5 days per week, 30 minutes per day. The activity should be enough to get your heart rate up. Anything is better than nothing if you have time constraints.  Do monthly self testicular checks in the shower. You are feeling for lumps/bumps that don't belong. If you feel anything like this, let me know!  Check your blood pressures 2-3 times per week, alternating the time of day you check it. If it is high, considering waiting 1-2 minutes and rechecking. If it gets higher, your anxiety is likely creeping up and we should avoid rechecking.   [facility] [address], [place], [place] [address] [phone]  Let us  know if you need anything.  Hip Exercises It is normal to feel mild stretching, pulling, tightness, or discomfort as you do these exercises, but you should stop right away if you feel sudden pain or your pain gets worse.   STRETCHING AND RANGE OF MOTION EXERCISES These exercises warm up your muscles and joints and improve the movement and flexibility of your hip. These exercises also help to relieve pain, numbness, and tingling. Exercise A: Hamstrings, Supine  Lie on your back. Loop a belt or towel over the ball of your left / right foot. The ball of your foot is on the walking surface, right under your toes. Straighten your left / right knee and slowly pull on the belt to raise your leg. Do not let your left / right knee bend while you do this. Keep your other leg flat on the floor. Raise the left / right leg until you feel a gentle stretch behind your left / right knee or thigh. Hold this position for 30 seconds. Slowly return your leg to the starting position. Repeat2 times. Complete this stretch 3 times per week. Exercise B: Hip Rotators  Lie on your back on a firm surface. Hold  your left / right knee with your left / right hand. Hold your ankle with your other hand. Gently pull your left / right knee and rotate your lower leg toward your other shoulder. Pull until you feel a stretch in your buttocks. Keep your hips and shoulders firmly planted while you do this stretch. Hold this position for 30 seconds. Repeat 2 times. Complete this stretch 3 times per week. Exercise C: V-Sit (Hamstrings and Adductors)  Sit on the floor with your legs extended in a large V shape. Keep your knees straight during this exercise. Start with your head and chest upright, then bend at your waist to reach for your left foot (position A). You should feel a stretch in your right inner thigh. Hold this position for 30 seconds. Then slowly return to the upright position. Bend at your waist to reach forward (position B). You should feel a stretch behind both of your thighs and knees. Hold this position for 30 seconds. Then slowly return to the upright position. Bend at your waist to reach for your right foot (position C). You should feel a stretch in your left inner thigh. Hold this position for 30 seconds. Then slowly return to the upright position. Repeat A, B, and C 2 times each. Complete this stretch 3 times per week. Exercise D: Lunge (Hip Flexors)  Place your left / right knee on the floor and bend  your other knee so that is directly over your ankle. You should be half-kneeling. Keep good posture with your head over your shoulders. Tighten your buttocks to point your tailbone downward. This helps your back to keep from arching too much. You should feel a gentle stretch in the front of your left / right thigh and hip. If you do not feel any resistance, slightly slide your other foot forward and then slowly lunge forward so your knee once again lines up over your ankle. Make sure your tailbone continues to point downward. Hold this position for 30 seconds. Repeat 2 times. Complete this  stretch 3 times per week.  STRENGTHENING EXERCISES These exercises build strength and endurance in your hip. Endurance is the ability to use your muscles for a long time, even after they get tired. Exercise E: Bridge (Hip Extensors)  Lie on your back on a firm surface with your knees bent and your feet flat on the floor. Tighten your buttocks muscles and lift your bottom off the floor until the trunk of your body is level with your thighs. Do not arch your back. You should feel the muscles working in your buttocks and the back of your thighs. If you do not feel these muscles, slide your feet 1-2 inches (2.5-5 cm) farther away from your buttocks. Hold this position for 3 seconds. Slowly lower your hips to the starting position. Repeat for a total of 10 repetitions. Let your muscles relax completely between repetitions. If this exercise is too easy, try doing it with your arms crossed over your chest. Repeat 2 times. Complete this exercise 3 times per week. Exercise F: Straight Leg Raises - Hip Abductors  Lie on your side with your left / right leg in the top position. Lie so your head, shoulder, knee, and hip line up with each other. You may bend your bottom knee to help you balance. Roll your hips slightly forward, so your hips are stacked directly over each other and your left / right knee is facing forward. Leading with your heel, lift your top leg 4-6 inches (10-15 cm). You should feel the muscles in your outer hip lifting. Do not let your foot drift forward. Do not let your knee roll toward the ceiling. Hold this position for 1 second. Slowly return to the starting position. Let your muscles relax completely between repetitions. Repeat for a total of 10 repetitions.  Repeat 2 times. Complete this exercise 3 times per week. Exercise G: Straight Leg Raises - Hip Adductors  Lie on your side with your left / right leg in the bottom position. Lie so your head, shoulder, knee, and hip line  up. You may place your upper foot in front to help you balance. Roll your hips slightly forward, so your hips are stacked directly over each other and your left / right knee is facing forward. Tense the muscles in your inner thigh and lift your bottom leg 4-6 inches (10-15 cm). Hold this position for 1 second. Slowly return to the starting position. Let your muscles relax completely between repetitions. Repeat for a total of 10 repetitions. Repeat 2 times. Complete this exercise 3 times per week. Exercise H: Straight Leg Raises - Quadriceps  Lie on your back with your left / right leg extended and your other knee bent. Tense the muscles in the front of your left / right thigh. When you do this, you should see your kneecap slide up or see increased dimpling just above your knee.  Tighten these muscles even more and raise your leg 4-6 inches (10-15 cm) off the floor. Hold this position for 3 seconds. Keep these muscles tense as you lower your leg. Relax the muscles slowly and completely between repetitions. Repeat for a total of 10 repetitions. Repeat 2 times. Complete this exercise 3 times per week. Exercise I: Hip Abductors, Standing Tie one end of a rubber exercise band or tubing to a secure surface, such as a table or pole. Loop the other end of the band or tubing around your left / right ankle. Keeping your ankle with the band or tubing directly opposite of the secured end, step away until there is tension in the tubing or band. Hold onto a chair as needed for balance. Lift your left / right leg out to your side. While you do this: Keep your back upright. Keep your shoulders over your hips. Keep your toes pointing forward. Make sure to use your hip muscles to lift your leg. Do not throw your leg or tip your body to lift your leg. Hold this position for 1 second. Slowly return to the starting position. Repeat for a total of 10 repetitions. Repeat 2 times. Complete this exercise 3 times  per week. Exercise J: Squats (Quadriceps) Stand in a door frame so your feet and knees are in line with the frame. You may place your hands on the frame for balance. Slowly bend your knees and lower your hips like you are going to sit in a chair. Keep your lower legs in a straight-up-and-down position. Do not let your hips go lower than your knees. Do not bend your knees lower than told by your health care provider. If your hip pain increases, do not bend as low. Hold this position for 1 second. Slowly push with your legs to return to standing. Do not use your hands to pull yourself to standing. Repeat for a total of 10 repetitions. Repeat 2 times. Complete this exercise 3 times per week. Make sure you discuss any questions you have with your health care provider. Document Released: 05/24/2005 Document Revised: 01/29/2016 Document Reviewed: 05/01/2015 Elsevier Interactive Patient Education  [employer].

## 2024-05-14 NOTE — Progress Notes (Signed)
 Chief Complaint  Patient presents with   Annual Exam    Patient presents today for a physical exam   Quality Metric Gaps    Hep C & HIV screening, HPV vaccines    Well Male Marcus Barker is here for a complete physical.   His last physical was >1 year ago.  Current diet: in general, a diet could be better.    Current exercise: limited Weight trend: stable Fatigue out of ordinary? No. Seat belt? Yes.   Advanced directive? No  Health maintenance Tetanus- Yes HIV- No Hep C- No  B/l hip pain going for 6 mo. Pain in groin. No inj or change in activity. Sharp pain. Associated loss of strength. Comes and goes. No bruising, redness, swelling, back pain, neuro s/s's. Worse when he goes up hills. 2 or 3 episodes per month, 2-3 days per episode. Has not tried anything at home.   Hypertension Patient presents for hypertension follow up. He does monitor home blood pressures. Blood pressures ranging on average from 140's/90's. He is compliant with medication- Norvasc  10 mg/d. Patient has these side effects of medication: none Diet/exercise as above.   Past Medical History:  Diagnosis Date   Frequent headaches    Hypertension      Past Surgical History:  Procedure Laterality Date   NO PAST SURGERIES      Medications  Medications Ordered Prior to Encounter[1]   Allergies Allergies[2]  Family History Family History  Problem Relation Age of Onset   Hypertension Neg Hx     Review of Systems: Constitutional: no fevers or chills Eye:  no recent significant change in vision Ear/Nose/Mouth/Throat:  Ears:  no hearing loss Nose/Mouth/Throat:  no complaints of nasal congestion, no sore throat Cardiovascular:  no chest pain Respiratory:  no shortness of breath Gastrointestinal:  no abdominal pain, no change in bowel habits GU:  Male: negative for dysuria Musculoskeletal/Extremities:  +b/l hip pain Integumentary (Skin/Breast):  no abnormal skin lesions reported Neurologic:  no  headaches Endocrine: No unexpected weight changes Hematologic/Lymphatic:  no night sweats  Exam BP (!) 142/92   Pulse 80   Temp 97.7 F (36.5 C)   Resp 16   Ht 5' 9 (1.753 m)   Wt (!) 372 lb 9.6 oz (169 kg)   SpO2 96%   BMI 55.02 kg/m  General:  well developed, well nourished, in no apparent distress Skin:  no significant moles, warts, or growths Head:  no masses, lesions, or tenderness Eyes:  pupils equal and round, sclera anicteric without injection Ears:  canals without lesions, TMs shiny without retraction, no obvious effusion, no erythema Nose:  nares patent, mucosa normal Throat/Pharynx:  lips and gingiva without lesion; tongue and uvula midline; non-inflamed pharynx; no exudates or postnasal drainage Neck: neck supple without adenopathy, thyromegaly, or masses Lungs:  clear to auscultation, breath sounds equal bilaterally, no respiratory distress Cardio:  regular rate and rhythm, no bruits, no LE edema Abdomen:  abdomen soft, nontender; bowel sounds normal; no masses or organomegaly Genital (male): Deferred Rectal: Deferred Musculoskeletal:  symmetrical muscle groups noted without atrophy or deformity Extremities:  no clubbing, cyanosis, or edema, no deformities, no skin discoloration Neuro:  gait normal; deep tendon reflexes normal and symmetric Psych: well oriented with normal range of affect and appropriate judgment/insight  Assessment and Plan  Well adult exam - Plan: CBC, Comprehensive metabolic panel with GFR, Lipid panel  Screening for HIV without presence of risk factors - Plan: HIV Antibody (routine testing w rflx)  Encounter for hepatitis C screening test for low risk patient - Plan: Hepatitis C antibody  Primary hypertension - Plan: amLODipine  (NORVASC ) 10 MG tablet, olmesartan  (BENICAR ) 20 MG tablet  Bilateral hip pain - Plan: DG HIP INFANT UNILAT W OR W/O PELVIS 2-3 VIEWS LEFT, DG HIP INFANT UNILAT W OR W/O PELVIS 2-3 VIEWS RIGHT   Well 33 y.o.  male. Counseled on diet and exercise. Self testicular exams recommended at least monthly.  Advanced directive form provided/requested today.  Screen Hep C and HIV.  HTN: Chronic, uncontrolled. Cont Norvasc  10 mg/d. Add Benicar  20 mg/d. Monitor BP at home. F/u in 1 mo to reck.  Hip pain: Famhx of hip OA at young age. Ck XR. Will do at [facility] location to avoid facility fee. Tylenol, heat, ice.  Other orders as above. Follow up in 1 year pending the above workup. The patient voiced understanding and agreement to the plan.  Mabel [place], DO 05/14/2024 10:38 AM     [1]  Current Outpatient Medications on File Prior to Visit  Medication Sig Dispense Refill   cetirizine (ZYRTEC) 10 MG tablet Take 10 mg by mouth daily.     fluticasone (FLONASE) 50 MCG/ACT nasal spray Place into both nostrils daily.     No current facility-administered medications on file prior to visit.  [2] No Known Allergies

## 2024-05-15 LAB — HIV ANTIBODY (ROUTINE TESTING W REFLEX)
HIV 1&2 Ab, 4th Generation: NONREACTIVE
HIV FINAL INTERPRETATION: NEGATIVE

## 2024-05-15 LAB — HEPATITIS C ANTIBODY: Hepatitis C Ab: NONREACTIVE

## 2024-05-17 ENCOUNTER — Ambulatory Visit (INDEPENDENT_AMBULATORY_CARE_PROVIDER_SITE_OTHER)

## 2024-05-17 ENCOUNTER — Other Ambulatory Visit: Payer: Self-pay

## 2024-05-17 DIAGNOSIS — E782 Mixed hyperlipidemia: Secondary | ICD-10-CM

## 2024-05-17 NOTE — Addendum Note (Signed)
 Addended by: Arhan Mcmanamon M on: 05/17/2024 04:00 PM   Modules accepted: Orders

## 2024-05-18 ENCOUNTER — Ambulatory Visit: Payer: Self-pay | Admitting: Family Medicine

## 2024-05-18 ENCOUNTER — Other Ambulatory Visit: Payer: Self-pay

## 2024-05-18 DIAGNOSIS — E782 Mixed hyperlipidemia: Secondary | ICD-10-CM

## 2024-05-18 LAB — HEMOGLOBIN A1C: Hgb A1c MFr Bld: 6.8 % — ABNORMAL HIGH (ref 4.6–6.5)

## 2024-05-27 ENCOUNTER — Ambulatory Visit (HOSPITAL_BASED_OUTPATIENT_CLINIC_OR_DEPARTMENT_OTHER)
Admission: RE | Admit: 2024-05-27 | Discharge: 2024-05-27 | Disposition: A | Discharge status: Home / Self Care | Source: Ambulatory Visit | Attending: Family Medicine | Admitting: Family Medicine

## 2024-05-27 DIAGNOSIS — M25552 Pain in left hip: Secondary | ICD-10-CM

## 2024-05-27 DIAGNOSIS — M25551 Pain in right hip: Secondary | ICD-10-CM

## 2024-05-28 ENCOUNTER — Encounter: Payer: Self-pay | Admitting: Family Medicine

## 2024-05-28 ENCOUNTER — Ambulatory Visit (INDEPENDENT_AMBULATORY_CARE_PROVIDER_SITE_OTHER): Admitting: Family Medicine

## 2024-05-28 VITALS — BP 142/88 | HR 100 | Temp 98.0°F | Resp 16 | Ht 69.0 in | Wt 375.2 lb

## 2024-05-28 DIAGNOSIS — M545 Low back pain, unspecified: Secondary | ICD-10-CM | POA: Diagnosis not present

## 2024-05-28 MED ORDER — METHOCARBAMOL 500 MG PO TABS: 500.0000 mg | ORAL_TABLET | Freq: Three times a day (TID) | ORAL | 0 refills | Status: AC | PRN

## 2024-05-28 NOTE — Progress Notes (Signed)
 Musculoskeletal Exam  Patient: Marcus Barker DOB: 18-Oct-1990  DOS: 05/28/2024  SUBJECTIVE:  Chief Complaint:   Chief Complaint  Patient presents with   Back Pain    Back Pain    Marcus Barker is a 34 y.o.  male for evaluation and treatment of back pain.   Onset:  4 days ago.  No Inj or change in activity.  Location: lower b/l Character:  searing  Progression of issue:  is unchanged Associated symptoms: no bruising, redness, swelling Denies bowel/bladder incontinence or weakness Treatment: to date has been acetaminophen.   Neurovascular symptoms: no  Past Medical History:  Diagnosis Date   Frequent headaches    Hypertension     Objective:  VITAL SIGNS: BP (!) 142/88 (BP Location: Left Arm, Cuff Size: Large)   Pulse 100   Temp 98 F (36.7 C) (Oral)   Resp 16   Ht 5' 9 (1.753 m)   Wt (!) 375 lb 3.2 oz (170.2 kg)   SpO2 98%   BMI 55.41 kg/m  Constitutional: Well formed, well developed. No acute distress. HENT: Normocephalic, atraumatic.  Thorax & Lungs:  No accessory muscle use Musculoskeletal: low back.   Tenderness to palpation: no Deformity: no Ecchymosis: no Straight leg test: negative for Poor hamstring flexibility b/l. Neurologic: Normal sensory function. No focal deficits noted. DTR's equal and symmetric in LE's. No clonus. Psychiatric: Normal mood. Age appropriate judgment and insight. Alert & oriented x 3.    Assessment:  Acute bilateral low back pain, unspecified whether sciatica present - Plan: methocarbamol  (ROBAXIN ) 500 MG tablet  Plan: Stretches/exercises, heat, ice, Tylenol. Muscle relaxer as above, warned about drowsiness.  F/u prn. The patient voiced understanding and agreement to the plan.   Mabel [place], DO 05/28/2024  8:16 AM

## 2024-05-28 NOTE — Patient Instructions (Signed)

## 2024-06-14 ENCOUNTER — Ambulatory Visit: Admitting: Family Medicine

## 2024-06-22 ENCOUNTER — Ambulatory Visit: Admitting: Family Medicine

## 2024-06-30 ENCOUNTER — Ambulatory Visit: Admitting: Family Medicine
